# Patient Record
Sex: Female | Born: 1950 | Race: White | Hispanic: No | Marital: Married | Smoking: Never smoker
Health system: Southern US, Community
[De-identification: ages and names within clinical notes are randomized; demographics above are authoritative.]

## PROBLEM LIST (undated history)

## (undated) HISTORY — PX: FOOT SURGERY: SHX648

## (undated) HISTORY — PX: BREAST BIOPSY: SHX20

---

## 2010-05-12 ENCOUNTER — Encounter: Admission: RE | Admit: 2010-05-12 | Discharge: 2010-05-12 | Payer: Self-pay | Admitting: Otolaryngology

## 2011-05-23 ENCOUNTER — Encounter: Payer: Self-pay | Admitting: Emergency Medicine

## 2011-05-23 ENCOUNTER — Emergency Department (HOSPITAL_BASED_OUTPATIENT_CLINIC_OR_DEPARTMENT_OTHER)
Admission: EM | Admit: 2011-05-23 | Discharge: 2011-05-23 | Disposition: A | Discharge status: Home / Self Care | Payer: 59 | Attending: Emergency Medicine | Admitting: Emergency Medicine

## 2011-05-23 DIAGNOSIS — R220 Localized swelling, mass and lump, head: Secondary | ICD-10-CM | POA: Insufficient documentation

## 2011-05-23 DIAGNOSIS — R221 Localized swelling, mass and lump, neck: Secondary | ICD-10-CM | POA: Insufficient documentation

## 2011-05-23 DIAGNOSIS — T63441A Toxic effect of venom of bees, accidental (unintentional), initial encounter: Secondary | ICD-10-CM

## 2011-05-23 DIAGNOSIS — R0602 Shortness of breath: Secondary | ICD-10-CM | POA: Insufficient documentation

## 2011-05-23 DIAGNOSIS — T63461A Toxic effect of venom of wasps, accidental (unintentional), initial encounter: Secondary | ICD-10-CM | POA: Insufficient documentation

## 2011-05-23 DIAGNOSIS — T6391XA Toxic effect of contact with unspecified venomous animal, accidental (unintentional), initial encounter: Secondary | ICD-10-CM | POA: Insufficient documentation

## 2011-05-23 MED ORDER — DIPHENHYDRAMINE HCL 50 MG/ML IJ SOLN
25.0000 mg | Freq: Once | INTRAMUSCULAR | Status: AC
  Administered 2011-05-23: 25 mg via INTRAVENOUS
  Filled 2011-05-23: qty 1

## 2011-05-23 MED ORDER — DIPHENHYDRAMINE HCL 25 MG PO TABS: 25.0000 mg | ORAL_TABLET | Freq: Four times a day (QID) | ORAL | Status: AC | PRN

## 2011-05-23 MED ORDER — PREDNISONE 10 MG PO TABS: 20.0000 mg | ORAL_TABLET | Freq: Every day | ORAL | Status: AC

## 2011-05-23 MED ORDER — FAMOTIDINE IN NACL 20-0.9 MG/50ML-% IV SOLN
20.0000 mg | Freq: Once | INTRAVENOUS | Status: AC
  Administered 2011-05-23: 20 mg via INTRAVENOUS
  Filled 2011-05-23: qty 50

## 2011-05-23 MED ORDER — SODIUM CHLORIDE 0.9 % IV SOLN
Freq: Once | INTRAVENOUS | Status: AC
  Administered 2011-05-23: 21:00:00 via INTRAVENOUS

## 2011-05-23 MED ORDER — METHYLPREDNISOLONE SODIUM SUCC 125 MG IJ SOLR
125.0000 mg | Freq: Once | INTRAMUSCULAR | Status: AC
  Administered 2011-05-23: 125 mg via INTRAVENOUS
  Filled 2011-05-23: qty 2

## 2011-05-23 MED ORDER — EPINEPHRINE 0.3 MG/0.3ML IJ DEVI: 0.3000 mg | INTRAMUSCULAR | Status: AC | PRN

## 2011-05-23 NOTE — ED Notes (Signed)
Patient is resting comfortably. No needs voiced at this time

## 2011-05-23 NOTE — ED Provider Notes (Signed)
History     Chief Complaint  Patient presents with  . Insect Bite   Patient is a 60 y.o. female presenting with allergic reaction. The history is provided by the patient.  Allergic Reaction The primary symptoms are  shortness of breath (chest tightness), rash and angioedema. The primary symptoms do not include cough or altered mental status. The current episode started 1 to 2 hours ago.  The rash is not associated with itching.  The angioedema began 1 to 2 hours ago. The angioedema has been gradually worsening since its onset. It is located on the lips. The angioedema is associated with shortness of breath (chest tightness).  The onset of the reaction was associated with insect bite/sting (stung behind the left knee by yellow jackets and has redness. ). Significant symptoms that are not present include eye redness, rhinorrhea or itching.  her lower lip has swollen up and she feels as if her tongue is swollen.   History reviewed. No pertinent past medical history.  History reviewed. No pertinent past surgical history.  No family history on file.  History  Substance Use Topics  . Smoking status: Never Smoker   . Smokeless tobacco: Not on file  . Alcohol Use: Yes     occasional    OB History    Grav Para Term Preterm Abortions TAB SAB Ect Mult Living                  Review of Systems  HENT: Positive for facial swelling. Negative for ear pain, congestion and rhinorrhea.   Eyes: Negative for photophobia, pain and redness.  Respiratory: Positive for chest tightness and shortness of breath (chest tightness). Negative for cough.   Gastrointestinal: Negative for abdominal distention.  Musculoskeletal: Negative for back pain.  Skin: Positive for rash. Negative for itching.  Neurological: Negative for seizures.  Psychiatric/Behavioral: Negative for altered mental status.    Physical Exam  BP 150/80  Pulse 78  Temp(Src) 98.3 F (36.8 C) (Oral)  Resp 16  SpO2 97%  Physical  Exam  Constitutional: She appears well-developed.  HENT:  Head: Normocephalic.       Swelling of left lower lip.   Eyes: Pupils are equal, round, and reactive to light.  Cardiovascular: Normal rate.   Pulmonary/Chest: Effort normal.  Abdominal: Soft.  Neurological: She is alert.  Skin: Skin is warm. Rash noted. There is erythema (large areas of redness without induration behind left knee. ).    ED Course  Procedures  MDM Yellow jacket stings followed with allergic reaction. Some lip swelling. Improving with ED stay. Chest was tight, but no wheezing. Will d/c.       Juliet Rude. Rubin Payor, MD 05/23/11 2300

## 2011-11-09 IMAGING — CR DG CERVICAL SPINE COMPLETE 4+V
5 series · 5 of 5 positions shown · non-contrast
Comparison: None.

CLINICAL DATA: 59-year-old female with right year and neck pain
posteriorly.  No known injury.

CERVICAL SPINE - COMPLETE 4+ VIEW

[w c-spine lat]
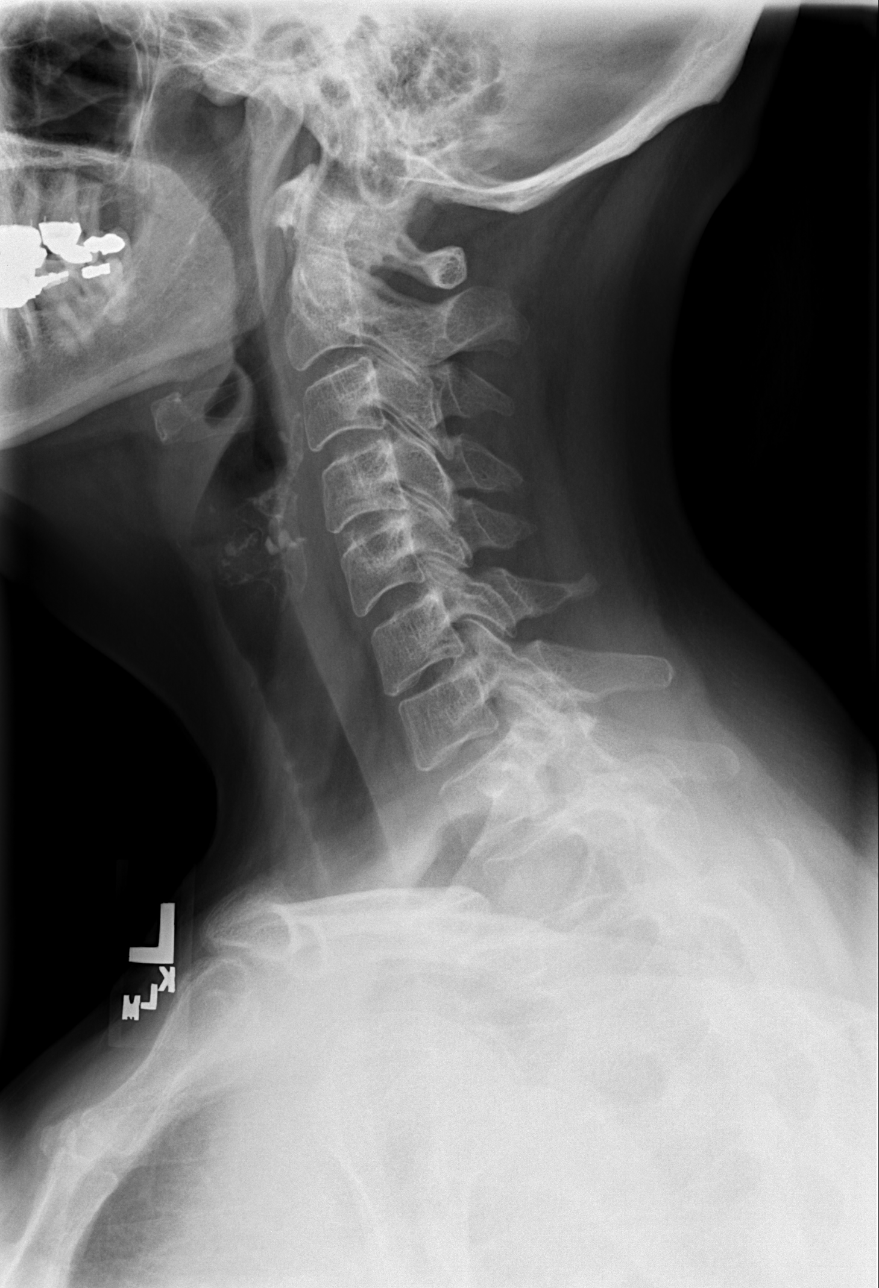

[w c-spine oblique (1 of 2)]
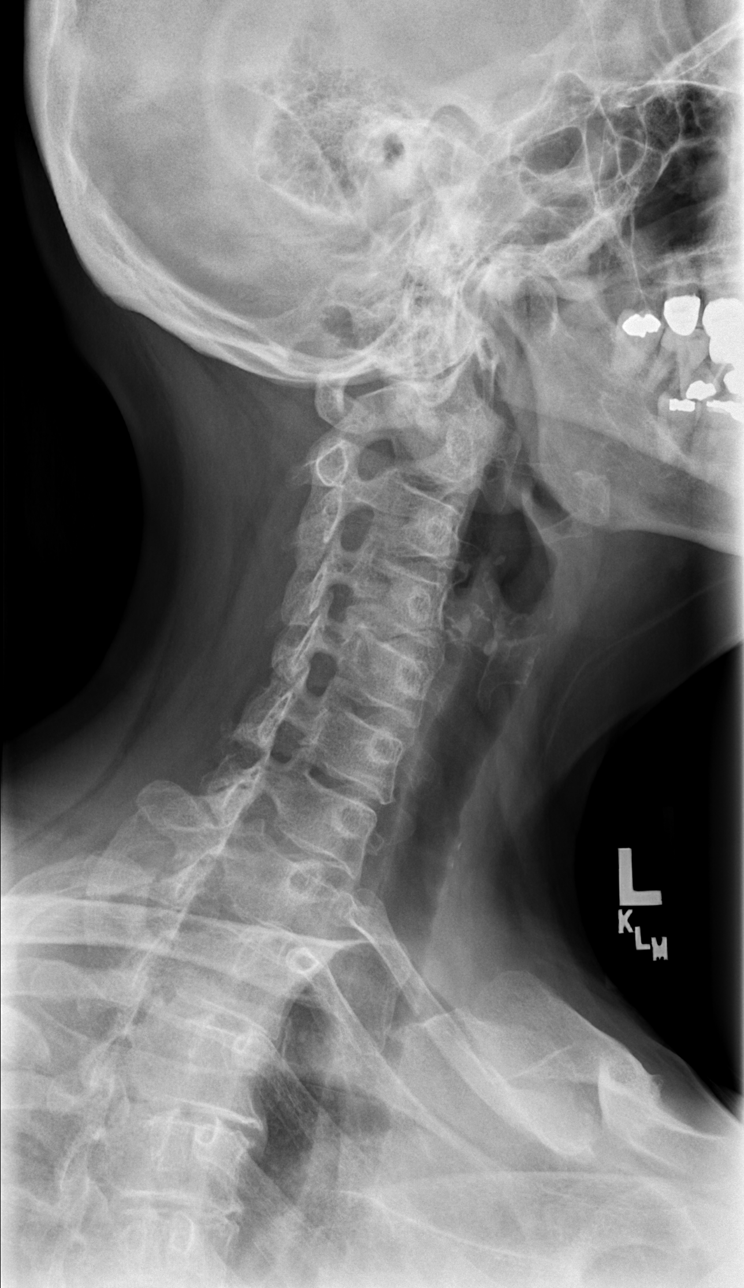

[w c-spine oblique (2 of 2)]
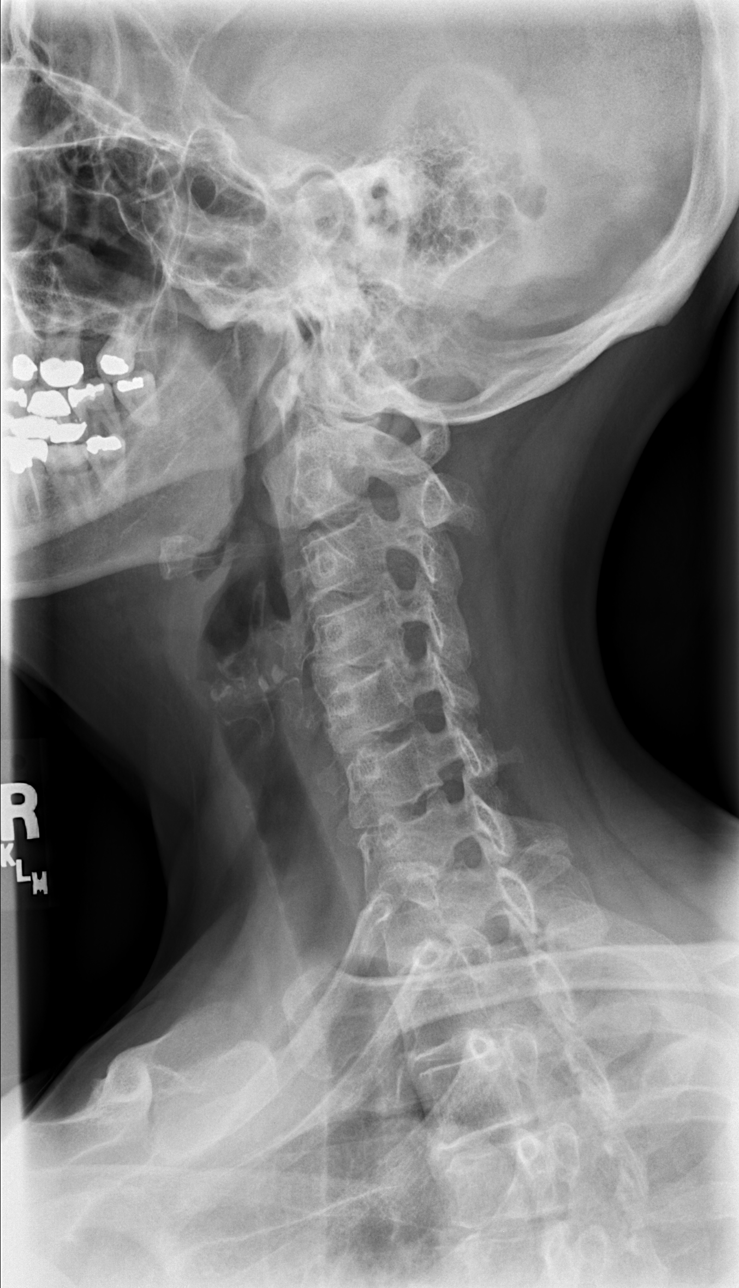

[w c-spine a.p. *]
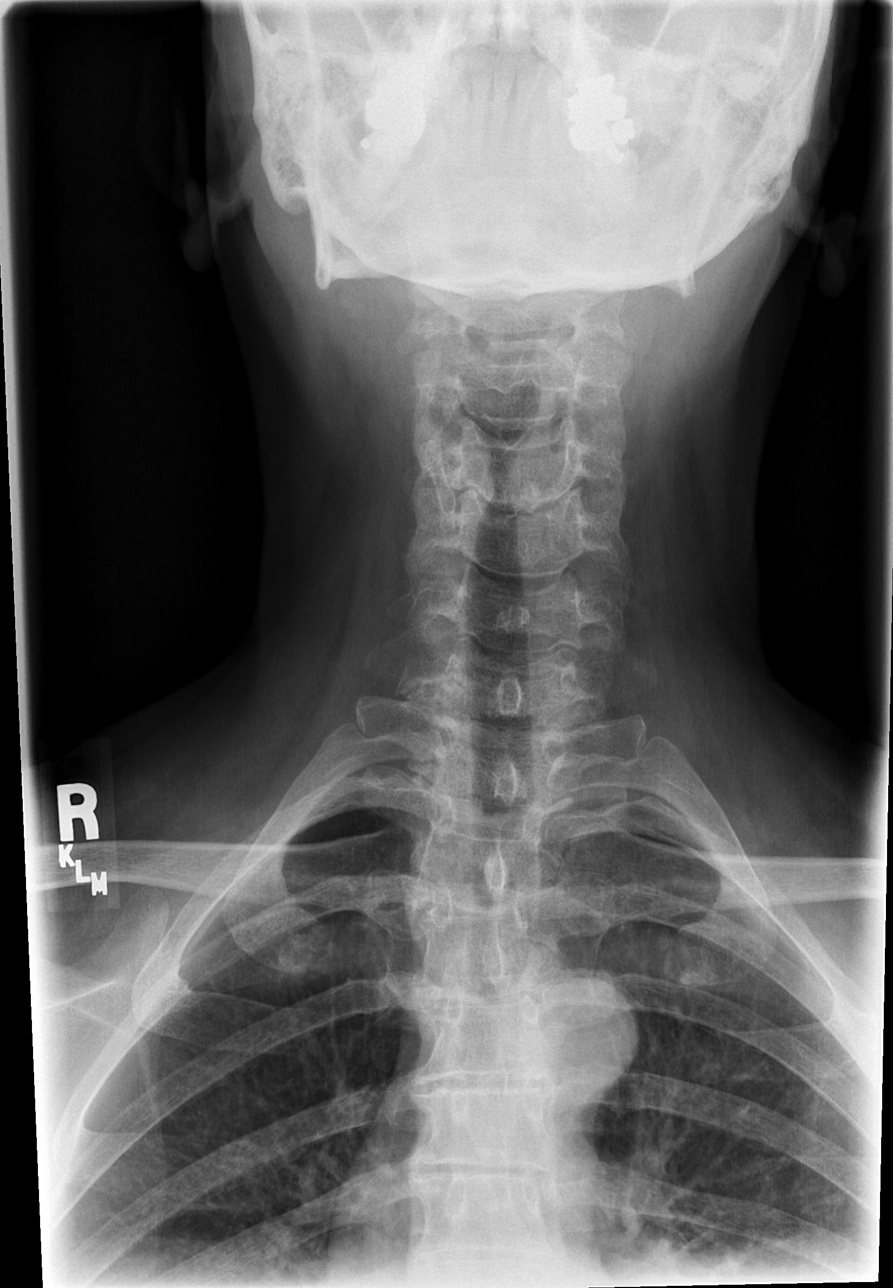

[w c-spine odontoid *]
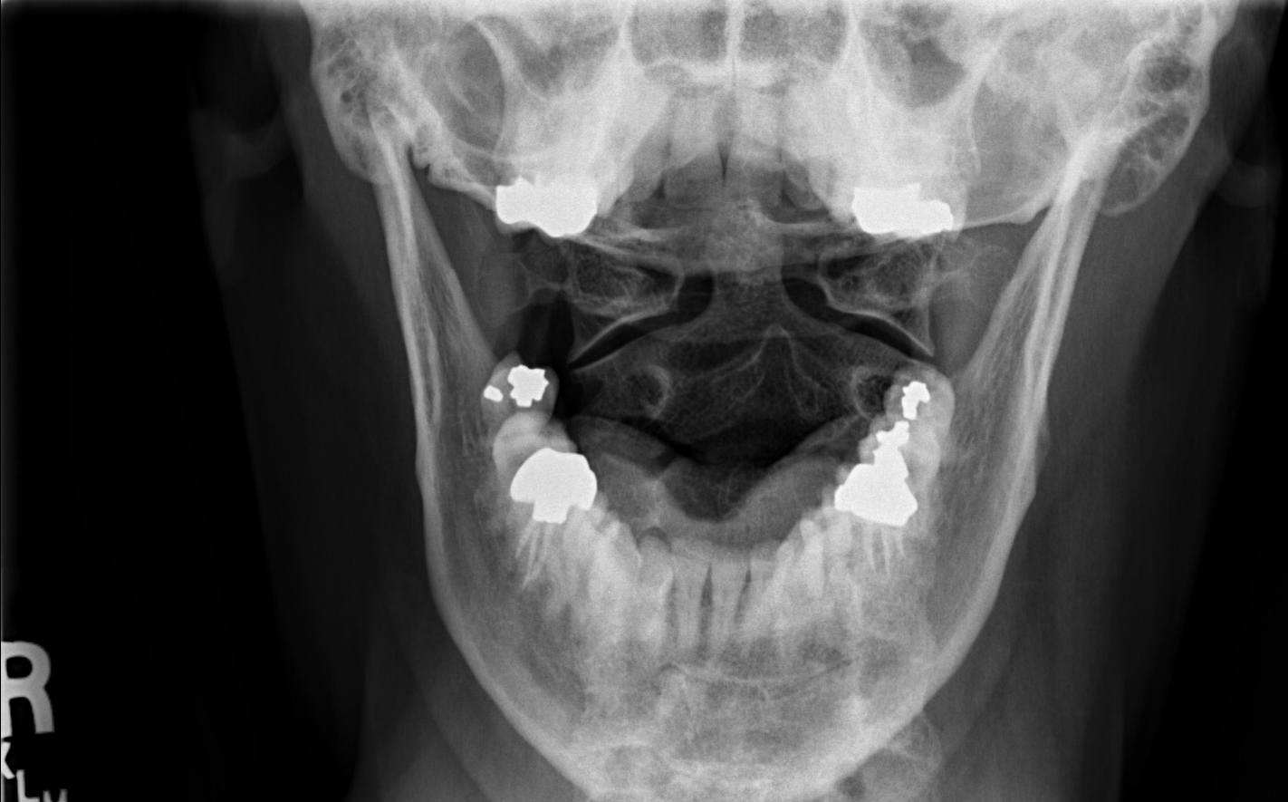

[5 of 5 positions shown; findings below may reference images not displayed]

FINDINGS: Straightening of cervical lordosis.  Prevertebral soft
tissues within normal limits.  The Cervicothoracic junction
alignment is within normal limits.  Bilateral posterior element
alignment is within normal limits.  AP alignment, C1-C2 alignment
and odontoid process within normal limits.  No significant cervical
disc space narrowing.  Occasional mild uncovertebral hypertrophy,
no significant osseous neural foraminal stenosis.  Visualized lung
apices are clear.
IMPRESSION: 1.  Nonspecific straightening of cervical lordosis may be
positional, reflect muscle spasm or soft tissue/ligamentous injury.
2. No acute osseous abnormality identified in the cervical spine.

## 2012-06-26 ENCOUNTER — Emergency Department (HOSPITAL_BASED_OUTPATIENT_CLINIC_OR_DEPARTMENT_OTHER)
Admission: EM | Admit: 2012-06-26 | Discharge: 2012-06-26 | Disposition: A | Discharge status: Home / Self Care | Payer: 59 | Attending: Emergency Medicine | Admitting: Emergency Medicine

## 2012-06-26 ENCOUNTER — Encounter (HOSPITAL_BASED_OUTPATIENT_CLINIC_OR_DEPARTMENT_OTHER): Payer: Self-pay | Admitting: Emergency Medicine

## 2012-06-26 DIAGNOSIS — T6391XA Toxic effect of contact with unspecified venomous animal, accidental (unintentional), initial encounter: Secondary | ICD-10-CM | POA: Insufficient documentation

## 2012-06-26 DIAGNOSIS — Z91038 Other insect allergy status: Secondary | ICD-10-CM | POA: Insufficient documentation

## 2012-06-26 DIAGNOSIS — T63481A Toxic effect of venom of other arthropod, accidental (unintentional), initial encounter: Secondary | ICD-10-CM

## 2012-06-26 DIAGNOSIS — T63461A Toxic effect of venom of wasps, accidental (unintentional), initial encounter: Secondary | ICD-10-CM | POA: Insufficient documentation

## 2012-06-26 MED ORDER — PREDNISONE 20 MG PO TABS: 60.0000 mg | ORAL_TABLET | Freq: Every day | ORAL | Status: AC

## 2012-06-26 MED ORDER — PREDNISONE 50 MG PO TABS
60.0000 mg | ORAL_TABLET | Freq: Once | ORAL | Status: AC
  Administered 2012-06-26: 60 mg via ORAL
  Filled 2012-06-26: qty 1

## 2012-06-26 MED ORDER — FAMOTIDINE 20 MG PO TABS
20.0000 mg | ORAL_TABLET | Freq: Once | ORAL | Status: AC
  Administered 2012-06-26: 20 mg via ORAL
  Filled 2012-06-26: qty 1

## 2012-06-26 MED ORDER — FAMOTIDINE 20 MG PO TABS: 20.0000 mg | ORAL_TABLET | Freq: Two times a day (BID) | ORAL | Status: AC

## 2012-06-26 NOTE — ED Provider Notes (Signed)
History     CSN: [id number]  Arrival date & time 06/26/12  1127   First MD Initiated Contact with Patient 06/26/12 1135      Chief Complaint  Patient presents with  . Allergic Reaction    (Consider location/radiation/quality/duration/timing/severity/associated sxs/prior treatment) HPI Pt with history of allergy to bee stings reports she was stung twice by a wasp just prior to arrival, once on forehead and then a few minutes later on the forearm. She has had marked swelling of her face but no throat swelling, tongue swelling. No difficulty with swallowing or breathing. No lightheadedness or dizziness. Did not use EpiPen, but took Benadryl 50mg  prior to arrival.   History reviewed. No pertinent past medical history.  Past Surgical History  Procedure Date  . Foot surgery   . Breast biopsy     No family history on file.  History  Substance Use Topics  . Smoking status: Never Smoker   . Smokeless tobacco: Not on file  . Alcohol Use: Yes     occasional    OB History    Grav Para Term Preterm Abortions TAB SAB Ect Mult Living                  Review of Systems All other systems reviewed and are negative except as noted in HPI.   Allergies  Review of patient's allergies indicates no known allergies.  Home Medications   Current Outpatient Rx  Name Route Sig Dispense Refill  . PROBIOTIC DAILY PO Oral Take by mouth.    Marland Kitchen CALCIUM CARBONATE-VITAMIN D 600-400 MG-UNIT PO TABS Oral Take 1 tablet by mouth daily.      . CHLORPHEN-PSEUDOEPHED-APAP 2-30-325 MG PO CAPS Oral Take 2 capsules by mouth 3 (three) times daily.      Marland Kitchen EPINEPHRINE 0.3 MG/0.3ML IJ DEVI Intramuscular Inject 0.3 mLs (0.3 mg total) into the muscle as needed. 2 Device 1  . MULTIVITAMIN PO Oral Take 1 tablet by mouth daily.      Marland Kitchen NAPROXEN SODIUM 220 MG PO TABS Oral Take 220 mg by mouth 2 (two) times daily with a meal.        BP 153/79  Pulse 83  Temp 98.8 F (37.1 C) (Oral)  Resp 16  SpO2  97%  Physical Exam  Nursing note and vitals reviewed. Constitutional: She is oriented to person, place, and time. She appears well-developed and well-nourished.  HENT:  Head: Normocephalic and atraumatic.  Eyes: EOM are normal. Pupils are equal, round, and reactive to light.  Neck: Normal range of motion. Neck supple.  Cardiovascular: Normal rate, normal heart sounds and intact distal pulses.   Pulmonary/Chest: Effort normal and breath sounds normal. No stridor.  Abdominal: Bowel sounds are normal. She exhibits no distension. There is no tenderness.  Musculoskeletal: Normal range of motion. She exhibits no edema and no tenderness.  Neurological: She is alert and oriented to person, place, and time. She has normal strength. No cranial nerve deficit or sensory deficit.  Skin: Skin is warm and dry. No rash noted.  Psychiatric: She has a normal mood and affect.    ED Course  Procedures (including critical care time)  Labs Reviewed - No data to display No results found.   No diagnosis found.    MDM  Pt given oral prednisone and pepcid. Observed without any progression of swelling. No concern for airway compromise or systemic anaphylaxis. Advised to return for any worsening.       Rice Walsh B. Bernette Mayers,  MD 06/26/12 1322

## 2012-06-26 NOTE — ED Notes (Signed)
Pt has slight decrease in bilateral periorbital swelling

## 2012-06-26 NOTE — ED Notes (Signed)
Pt c/o allergic reaction to bee sting (wasp); stung x 2 around 1015; significant bilateral periorbital edema  & redness noted. Pt took Benadryl 50 mg at 1030.

## 2015-11-12 ENCOUNTER — Encounter: Payer: Self-pay | Admitting: Physical Therapy

## 2015-11-12 ENCOUNTER — Ambulatory Visit: Payer: Commercial Managed Care - HMO | Attending: Sports Medicine | Admitting: Physical Therapy

## 2015-11-12 DIAGNOSIS — M25551 Pain in right hip: Secondary | ICD-10-CM

## 2015-11-12 DIAGNOSIS — R262 Difficulty in walking, not elsewhere classified: Secondary | ICD-10-CM | POA: Diagnosis present

## 2015-11-12 NOTE — Therapy (Signed)
[facility] [address] [place], [place], [address] Phone: [phone]   Fax:  [phone]  Physical Therapy Evaluation  Patient Details  Name: Ashley Barron MRN: [id number] Date of Birth: 29-Dec-1950 Referring Provider: Leonel RamsayFelder  Encounter Date: 11/12/2015      PT End of Session - 11/12/15 1138    Visit Number 1   Date for PT Re-Evaluation 01/13/16   PT Start Time 1045   PT Stop Time 1135   PT Time Calculation (min) 50 min   Activity Tolerance Patient tolerated treatment well   Behavior [facility] Therapy [facility] for tasks assessed/performed      History reviewed. No pertinent past medical history.  Past Surgical History  Procedure Laterality Date  . Foot surgery    . Breast biopsy      There were no vitals filed for this visit.  Visit Diagnosis:  Right hip pain - Plan: PT plan of care cert/re-cert  Difficulty walking - Plan: PT plan of care cert/re-cert      Subjective Assessment - 11/12/15 1103    Subjective Pateint underwent a right THR (posterior approach) on 10/21/15.  Had home PT until last Wednesday.  She reports that she was progressing well but had an infectin start last week.   Patient Stated Goals walk and have no pain   Currently in Pain? Yes   Pain Score 5    Pain Location Hip   Pain Orientation Right   Pain Descriptors / Indicators Aching;Sharp   Pain Type Surgical pain   Pain Onset 1 to 4 weeks ago   Pain Frequency Constant   Aggravating Factors  being up and walking getting in and out of the car 7/10   Pain Relieving Factors rest, pain meds   Effect of Pain on Daily Activities limts everything            [facility] PT Assessment - 11/12/15 0001    Assessment   Medical Diagnosis s/p right THR   Referring Provider Felder   Onset Date/Surgical Date 10/21/15   Next MD Visit 12/02/15   Prior Therapy at home   Precautions   Precautions Posterior Hip   Balance Screen   Has the patient fallen in  the past 6 months No   Has the patient had a decrease in activity level because of a fear of falling?  No   Is the patient reluctant to leave their home because of a fear of falling?  No   Home Environment   Additional Comments lives with spouse, 3 steps into the home, normally does housework   Prior Function   Level of Independence Independent   Vocation Full time employment   Vocation Requirements teaches preschool   Leisure no exercise   AROM   Overall AROM Comments AROM of the right hip in standing flexion 55 degrees, abduction 10, extension 5 degrees all motions increase the pain   Strength   Overall Strength Comments 3+/5 for the right hip with some pain   Palpation   Palpation comment she is tender and sore in the distal scar   Ambulation/Gait   Gait Comments gait is with a FWW, slow, mild antalgic gait on the right                   [facility] - 11/12/15 0001    Exercises   Exercises Knee/Hip   Knee/Hip Exercises: Aerobic   Nustep Level 3 x 5 minutes  Knee/Hip Exercises: Machines for Strengthening   Cybex Knee Extension 5# 2x10,    Cybex Knee Flexion 20# 2x10                  PT Short Term Goals - 11/12/15 1142    PT SHORT TERM GOAL #1   Title verbalize understanding of THR precautions   Time 1   Period Weeks   Status New           PT Long Term Goals - 11/12/15 1143    PT LONG TERM GOAL #1   Title decrease pain 50%   Time 8   Period Weeks   Status New   PT LONG TERM GOAL #2   Title increase AROM of the right hip to 90 degrees flexion in standing   Time 8   Period Weeks   Status New   PT LONG TERM GOAL #3   Title walk without asssitive device with minimal deviation   Time 8   Period Weeks   Status New   PT LONG TERM GOAL #4   Title increase right hip strength to 4/5   Time 8   Period Weeks   Status New               Plan - 11/12/15 1139    Clinical Impression Statement Patient underwent a  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  She reports that she had been having hip pain for 4 years.  She describes pain and tenderness in the scar area.  She does understand the THR precautions.  I went over them again with her today..  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   Pt will benefit from skilled therapeutic intervention in order to improve on the following deficits Abnormal gait;Cardiopulmonary status limiting activity;Decreased activity tolerance;Decreased balance;Decreased range of motion;Decreased mobility;Decreased scar mobility;Decreased strength;Difficulty walking;Impaired flexibility;Pain   Rehab Potential Good   PT Frequency 2x / week   PT Duration 8 weeks   PT Treatment/Interventions ADLs/Self Care Home Management;Electrical Stimulation;Cryotherapy;Therapeutic exercise;Therapeutic activities;Functional mobility training;Stair training;Gait training;Balance training;Patient/family education;Manual techniques;Scar mobilization   PT Next Visit Plan Alowly add exercises as tolerated as well as gait   Consulted and Agree with Plan of Care Patient         Problem List There are no active problems to display for this patient.   Jearld Lesch., PT 11/12/2015, 11:49 AM  [facility] [address] [facility] [address] [place], [place], [address] Phone: [phone]   Fax:  [phone]  Name: Ashley Barron MRN: [id number] Date of Birth: [birth year]

## 2015-11-15 ENCOUNTER — Ambulatory Visit: Payer: Commercial Managed Care - HMO | Admitting: Physical Therapy

## 2015-11-15 ENCOUNTER — Encounter: Payer: Self-pay | Admitting: Physical Therapy

## 2015-11-15 DIAGNOSIS — R262 Difficulty in walking, not elsewhere classified: Secondary | ICD-10-CM

## 2015-11-15 DIAGNOSIS — M25551 Pain in right hip: Secondary | ICD-10-CM | POA: Diagnosis not present

## 2015-11-15 NOTE — Therapy (Signed)
[facility] [address] [place], [place], [address] Phone: [phone]   Fax:  [phone]  Physical Therapy Treatment  Patient Details  Name: Ashley Barron MRN: [id number] Date of Birth: [birth year] Referring Provider: Leonel Ramsay  Encounter Date: 11/15/2015      PT End of Session - 11/15/15 1142    Visit Number 2   Date for PT Re-Evaluation 01/13/16   PT Start Time 1100   PT Stop Time 1142   PT Time Calculation (min) 42 min   Activity Tolerance Patient tolerated treatment well   Behavior During Therapy [facility] for tasks assessed/performed      History reviewed. No pertinent past medical history.  Past Surgical History  Procedure Laterality Date  . Foot surgery    . Breast biopsy      There were no vitals filed for this visit.  Visit Diagnosis:  Difficulty walking  Right hip pain      Subjective Assessment - 11/15/15 1054    Subjective Pt reports that her R leg was aching last night, otherwise no issues.   Currently in Pain? Yes   Pain Score 4    Pain Location Hip   Pain Orientation Right                         [facility] Adult PT Treatment/Exercise - 11/15/15 0001    Ambulation/Gait   Ambulation/Gait Yes   Ambulation Distance (Feet) 60 Feet   Assistive device Rolling walker   Gait Pattern Decreased arm swing - right;Decreased arm swing - left;Step-through pattern  slight antalgic, Reports that she feels like her RLE is longer than her LLE.   Gait velocity slow    Gait Comments R Lateral sway, 2nd gait trial with single point cane 60 ft. Pt given skilled demo on proper use os SPC.    Knee/Hip Exercises: Aerobic   Nustep Level 3 x 6 minutes   Knee/Hip Exercises: Standing   Forward Step Up 2 sets;10 reps;Right;Step Height: 4"  with RW   Knee/Hip Exercises: Seated   Long Arc Quad 2 sets;10 reps  3lb   Ball Squeeze 2x15    Abduction/Adduction  2 sets;15 reps   Abd/Adduction Limitations  yellow Tband    Sit to [employer] 2 sets;5 reps;with UE support                  PT Short Term Goals - 11/12/15 1142    PT SHORT TERM GOAL #1   Title verbalize understanding of THR precautions   Time 1   Period Weeks   Status New           PT Long Term Goals - 11/12/15 1143    PT LONG TERM GOAL #1   Title decrease pain 50%   Time 8   Period Weeks   Status New   PT LONG TERM GOAL #2   Title increase AROM of the right hip to 90 degrees flexion in standing   Time 8   Period Weeks   Status New   PT LONG TERM GOAL #3   Title walk without asssitive device with minimal deviation   Time 8   Period Weeks   Status New   PT LONG TERM GOAL #4   Title increase right hip strength to 4/5   Time 8   Period Weeks   Status New  Plan - 11/15/15 1142    Clinical Impression Statement Pt able to progress to gym level interventions this date. Tolerated all interventions well with out c/o of increase pain. Does have some soreness from muscle fatigue. Does ambulate with a slight antalgic gait. Pt reports that her RLE feels longer than her L.   Pt will benefit from skilled therapeutic intervention in order to improve on the following deficits Abnormal gait;Cardiopulmonary status limiting activity;Decreased activity tolerance;Decreased balance;Decreased range of motion;Decreased mobility;Decreased scar mobility;Decreased strength;Difficulty walking;Impaired flexibility;Pain   Rehab Potential Good   PT Frequency 2x / week   PT Duration 8 weeks   PT Treatment/Interventions ADLs/Self Care Home Management;Electrical Stimulation;Cryotherapy;Therapeutic exercise;Therapeutic activities;Functional mobility training;Stair training;Gait training;Balance training;Patient/family education;Manual techniques;Scar mobilization   PT Next Visit Plan Slowly add exercises as tolerated as well as gait        Problem List There are no active problems to display for this  patient.   Grayce Sessionsonald G Annessa Satre, PTA  11/15/2015, 11:46 AM  [facility] [address] [place], [place], [address] Phone: [phone]   Fax:  [phone]  Name: Ashley MauKaren Barron MRN: [id number] Date of Birth: Mar 16, 1951

## 2015-11-19 ENCOUNTER — Encounter: Payer: Self-pay | Admitting: Physical Therapy

## 2015-11-19 ENCOUNTER — Ambulatory Visit: Payer: Commercial Managed Care - HMO | Attending: Sports Medicine | Admitting: Physical Therapy

## 2015-11-19 DIAGNOSIS — R262 Difficulty in walking, not elsewhere classified: Secondary | ICD-10-CM | POA: Diagnosis present

## 2015-11-19 DIAGNOSIS — M25551 Pain in right hip: Secondary | ICD-10-CM | POA: Insufficient documentation

## 2015-11-19 NOTE — Therapy (Signed)
[facility] [address] [place], [place], [address] Phone: [phone]   Fax:  [phone]  Physical Therapy Treatment  Patient Details  Name: Ashley Barron MRN: [id number] Date of Birth: Nov 29, 1950 Referring Provider: Leonel Ramsay  Encounter Date: 11/19/2015      PT End of Session - 11/19/15 1143    Visit Number 3   Date for PT Re-Evaluation 01/13/16   PT Start Time 1100   PT Stop Time 1143   PT Time Calculation (min) 43 min   Activity Tolerance Patient tolerated treatment well   Behavior During Therapy [facility] for tasks assessed/performed      History reviewed. No pertinent past medical history.  Past Surgical History  Procedure Laterality Date  . Foot surgery    . Breast biopsy      There were no vitals filed for this visit.  Visit Diagnosis:  Right hip pain  Difficulty walking      Subjective Assessment - 11/19/15 1100    Subjective Pt reports that she had aching pain in her R hip yesterday otherwise no new problems.    Currently in Pain? Yes   Pain Score 4    Pain Location Hip   Pain Orientation Right                         [facility] Adult PT Treatment/Exercise - 11/19/15 0001    Ambulation/Gait   Ambulation/Gait Yes   Ambulation Distance (Feet) 120 Feet   Assistive device Body weight support system;Straight cane   Gait Pattern Decreased hip/knee flexion - right   Gait velocity slow   Gait Comments Minor R lateral sway, 2nd gait trial without AD R lateral sway increased.   High Level Balance   High Level Balance Activities Side stepping;Backward walking   Exercises   Exercises Knee/Hip   Knee/Hip Exercises: Aerobic   Nustep Level 3 x 6 minutes   Knee/Hip Exercises: Machines for Strengthening   Cybex Knee Extension 5# 2x15    Cybex Knee Flexion 15# 2x15   Knee/Hip Exercises: Standing   Forward Step Up 2 sets;Right;Step Height: 4";15 reps  RW, some pain 2nd set    Other Standing Knee  Exercises Standing hip Abd,Flx, and Ext with RW 2x10    Other Standing Knee Exercises Calf raises with RW 2x10    Knee/Hip Exercises: Seated   Ball Squeeze 2x15    Abduction/Adduction  2 sets;15 reps   Abd/Adduction Limitations blue Tband    Sit to Sand 2 sets;5 reps;with UE support                  PT Short Term Goals - 11/12/15 1142    PT SHORT TERM GOAL #1   Title verbalize understanding of THR precautions   Time 1   Period Weeks   Status New           PT Long Term Goals - 11/12/15 1143    PT LONG TERM GOAL #1   Title decrease pain 50%   Time 8   Period Weeks   Status New   PT LONG TERM GOAL #2   Title increase AROM of the right hip to 90 degrees flexion in standing   Time 8   Period Weeks   Status New   PT LONG TERM GOAL #3   Title walk without asssitive device with minimal deviation   Time 8   Period Weeks   Status  New   PT LONG TERM GOAL #4   Title increase right hip strength to 4/5   Time 8   Period Weeks   Status New               Plan - 11/19/15 1144    Clinical Impression Statement Pt tolerated exercises well this date. Does report R hip pain with Step ups. Pt able to progress with side steps and backwards walking.     Pt will benefit from skilled therapeutic intervention in order to improve on the following deficits Abnormal gait;Cardiopulmonary status limiting activity;Decreased activity tolerance;Decreased balance;Decreased range of motion;Decreased mobility;Decreased scar mobility;Decreased strength;Difficulty walking;Impaired flexibility;Pain   Rehab Potential Good   PT Frequency 2x / week   PT Duration 8 weeks   PT Treatment/Interventions ADLs/Self Care Home Management;Electrical Stimulation;Cryotherapy;Therapeutic exercise;Therapeutic activities;Functional mobility training;Stair training;Gait training;Balance training;Patient/family education;Manual techniques;Scar mobilization   PT Next Visit Plan Slowly add exercises as  tolerated as well as gait        Problem List There are no active problems to display for this patient.   Grayce Sessionsonald G Hunter Pinkard, PTA  11/19/2015, 11:46 AM  [facility] [address] [place], [place], [address] Phone: [phone]   Fax:  [phone]  Name: Ashley Barron MRN: [id number] Date of Birth: 03/03/1951

## 2015-11-21 ENCOUNTER — Encounter: Payer: Self-pay | Admitting: Physical Therapy

## 2015-11-21 ENCOUNTER — Ambulatory Visit: Payer: Commercial Managed Care - HMO | Admitting: Physical Therapy

## 2015-11-21 DIAGNOSIS — R262 Difficulty in walking, not elsewhere classified: Secondary | ICD-10-CM

## 2015-11-21 DIAGNOSIS — M25551 Pain in right hip: Secondary | ICD-10-CM | POA: Diagnosis not present

## 2015-11-21 NOTE — Therapy (Signed)
[place], [place], [address] Phone: [phone]   Fax:  [phone]  Physical Therapy Treatment  Patient Details  Name: Ashley Barron MRN: [id number] Date of Birth: Jul 27, 1951 Referring Provider: Davina Poke  Encounter Date: 11/21/2015      PT End of Session - 11/21/15 1511    Visit Number 4   Date for PT Re-Evaluation 01/13/16   PT Start Time 0938   PT Stop Time 1829   PT Time Calculation (min) 42 min      History reviewed. No pertinent past medical history.  Past Surgical History  Procedure Laterality Date  . Foot surgery    . Breast biopsy      There were no vitals filed for this visit.  Visit Diagnosis:  Difficulty walking  Right hip pain      Subjective Assessment - 11/21/15 1432    Subjective "Things are going fine,but I was sore yesterday because my husband made me walk around [place] and [employer]"   Currently in Pain? Yes   Pain Score 2    Pain Location Hip   Pain Orientation Right                         [facility] Adult PT Treatment/Exercise - 11/21/15 0001    Ambulation/Gait   Ambulation/Gait Yes   Ambulation Distance (Feet) 180 Feet   Assistive device Straight cane   Gait velocity slow   Gait Comments I with SPC    High Level Balance   High Level Balance Comments SLS RLE 3 x 10 sec    Knee/Hip Exercises: Aerobic   Nustep Level 3 x 6 minutes   Knee/Hip Exercises: Machines for Strengthening   Cybex Knee Extension 10# 3x10    Cybex Knee Flexion 20# 3x10   Cybex Leg Press #20 3x10   Knee/Hip Exercises: Standing   Hip Abduction Right;2 sets;10 reps  #2    Hip Extension Right;2 sets;10 reps  #2   Other Standing Knee Exercises Standing hip and kne flex #2 2x10   Knee/Hip Exercises: Seated   Ball Squeeze 2x15    Abduction/Adduction  2 sets;10 reps  manual resistance.    Sit to Sand 10 reps;with UE support;3 sets                  PT Short  Term Goals - 11/12/15 1142    PT SHORT TERM GOAL #1   Title verbalize understanding of THR precautions   Time 1   Period Weeks   Status New           PT Long Term Goals - 11/21/15 1514    PT LONG TERM GOAL #1   Title decrease pain 50%   PT LONG TERM GOAL #3   Title walk without asssitive device with minimal deviation   Status Partially Met               Plan - 11/21/15 1511    Clinical Impression Statement Pt performed well this PT date. Pt able to tolerate more intense interventions without c/o increase pain. Pt able to progress to leg press and standing activities with weight.    Pt will benefit from skilled therapeutic intervention in order to improve on the following deficits Abnormal gait;Cardiopulmonary status limiting activity;Decreased activity tolerance;Decreased balance;Decreased range of motion;Decreased mobility;Decreased scar mobility;Decreased strength;Difficulty walking;Impaired flexibility;Pain   Rehab Potential Good   PT Frequency 2x /  week   PT Duration 8 weeks   PT Treatment/Interventions ADLs/Self Care Home Management;Electrical Stimulation;Cryotherapy;Therapeutic exercise;Therapeutic activities;Functional mobility training;Stair training;Gait training;Balance training;Patient/family education;Manual techniques;Scar mobilization   PT Next Visit Plan progress as tolerated        Problem List There are no active problems to display for this patient.   Scot Jun, PTA   11/21/2015, 3:15 PM  [address] [place], [place], [address] Phone: [phone]   Fax:  [phone]  Name: Ashley Barron MRN: [id number] Date of Birth: [birth year]

## 2015-11-26 ENCOUNTER — Encounter: Payer: Self-pay | Admitting: Physical Therapy

## 2015-11-26 ENCOUNTER — Ambulatory Visit: Payer: Commercial Managed Care - HMO | Admitting: Physical Therapy

## 2015-11-26 DIAGNOSIS — R262 Difficulty in walking, not elsewhere classified: Secondary | ICD-10-CM

## 2015-11-26 DIAGNOSIS — M25551 Pain in right hip: Secondary | ICD-10-CM | POA: Diagnosis not present

## 2015-11-26 NOTE — Therapy (Signed)
[place], [place], [address] Phone: [phone]   Fax:  [phone]  Physical Therapy Treatment  Patient Details  Name: Ashley Barron MRN: [id number] Date of Birth: [birth year] Referring Provider: Davina Poke  Encounter Date: 11/26/2015      PT End of Session - 11/26/15 1601    Visit Number 5   Date for PT Re-Evaluation 01/13/16   PT Start Time 4403   PT Stop Time 1601   PT Time Calculation (min) 46 min   Activity Tolerance Patient tolerated treatment well   Behavior During Therapy [facility] for tasks assessed/performed      History reviewed. No pertinent past medical history.  Past Surgical History  Procedure Laterality Date  . Foot surgery    . Breast biopsy      There were no vitals filed for this visit.  Visit Diagnosis:  Right hip pain  Difficulty walking      Subjective Assessment - 11/26/15 1516    Subjective "Im doing good." "I was pretty tired after last treatment "   Currently in Pain? Yes   Pain Score 3    Pain Location Hip   Pain Orientation Right            [facility] PT Assessment - 11/26/15 0001    AROM   Overall AROM Comments AROM of the right hip in standing flexion 85 degrees, abduction 50, extension 50 degrees all motions increase the pain                     [facility] Adult PT Treatment/Exercise - 11/26/15 0001    Ambulation/Gait   Ambulation/Gait Yes   Ambulation Distance (Feet) 250 Feet   Assistive device None   Ambulation Surface Level;Indoor   Gait Comments slight limp, Negotiated 2 flights of stairs with one rail alternating pattern SBA.   Knee/Hip Exercises: Aerobic   Nustep Level 3 x 7 minutes   Knee/Hip Exercises: Machines for Strengthening   Cybex Knee Extension 10# 2x15    Cybex Knee Flexion 20# 2x15   Cybex Leg Press #20 2x15   Knee/Hip Exercises: Standing   Hip Abduction 15 reps;Both;1 set;Knee straight   Abduction Limitations 2   Hip  Extension 1 set;15 reps;Knee straight   Extension Limitations 2   Other Standing Knee Exercises Standing hip and knee flex #2 2x10   Other Standing Knee Exercises Standing march #2 2x10    Knee/Hip Exercises: Seated   Long Arc Quad Both;1 set;15 reps   Long Arc Quad Weight 2 lbs.   Ball Squeeze 2x15    Abduction/Adduction  2 sets;10 reps  manual resistance    Sit to Sand 10 reps;without UE support;3 sets  from airex on mat table                   PT Short Term Goals - 11/12/15 1142    PT SHORT TERM GOAL #1   Title verbalize understanding of THR precautions   Time 1   Period Weeks   Status New           PT Long Term Goals - 11/26/15 1602    PT LONG TERM GOAL #1   Title decrease pain 50%   Status Partially Met   PT LONG TERM GOAL #2   Title increase AROM of the right hip to 90 degrees flexion in standing   Status Partially Met   PT LONG TERM GOAL #3  Title walk without asssitive device with minimal deviation   Status Partially Met   PT LONG TERM GOAL #4   Title increase right hip strength to 4/5   Status Partially Met               Plan - 11/26/15 1603    Clinical Impression Statement Pt continue to do well in PT. Pt has increased her overall gait distance with only a mild limp without AD. Pt also able to progress to stair negotiation alternating pattern one rail  SBA. Pt able to tolerated additional interventions with increased weight/resistance.   Pt will benefit from skilled therapeutic intervention in order to improve on the following deficits Abnormal gait;Cardiopulmonary status limiting activity;Decreased activity tolerance;Decreased balance;Decreased range of motion;Decreased mobility;Decreased scar mobility;Decreased strength;Difficulty walking;Impaired flexibility;Pain   Rehab Potential Good   PT Frequency 2x / week   PT Duration 8 weeks   PT Treatment/Interventions ADLs/Self Care Home Management;Electrical Stimulation;Cryotherapy;Therapeutic  exercise;Therapeutic activities;Functional mobility training;Stair training;Gait training;Balance training;Patient/family education;Manual techniques;Scar mobilization   PT Next Visit Plan progress as tolerated        Problem List There are no active problems to display for this patient.   Scot Jun, PTA  11/26/2015, 4:05 PM  [place] [address] [place], [place], [address] Phone: [phone]   Fax:  [phone]  Name: Audra Kagel MRN: [id number] Date of Birth: 1951/06/22

## 2015-11-28 ENCOUNTER — Encounter: Payer: Self-pay | Admitting: Physical Therapy

## 2015-11-28 ENCOUNTER — Ambulatory Visit: Payer: Commercial Managed Care - HMO | Admitting: Physical Therapy

## 2015-11-28 DIAGNOSIS — M25551 Pain in right hip: Secondary | ICD-10-CM

## 2015-11-28 DIAGNOSIS — R262 Difficulty in walking, not elsewhere classified: Secondary | ICD-10-CM

## 2015-11-28 NOTE — Therapy (Addendum)
[place], [place], [address] Phone: [phone]   Fax:  [phone]  Physical Therapy Treatment  Patient Details  Name: Ashley Barron MRN: [id number] Date of Birth: Aug 19, 1951 Referring Provider: Davina Poke  Encounter Date: 11/28/2015      PT End of Session - 11/28/15 1558    PT Start Time 1515   PT Stop Time 1559   PT Time Calculation (min) 44 min      History reviewed. No pertinent past medical history.  Past Surgical History  Procedure Laterality Date  . Foot surgery    . Breast biopsy      There were no vitals filed for this visit.  Visit Diagnosis:  Difficulty walking  Right hip pain      Subjective Assessment - 11/28/15 1523    Subjective "about the same, still hurts about a 4, just this tight feeling and aching, sometimes a sharp pain not very often"   Currently in Pain? Yes   Pain Score 4                          [facility] Adult PT Treatment/Exercise - 11/28/15 0001    Ambulation/Gait   Gait Comments slight limp, Negotiated 2 flights of stairs with one rail alternating pattern mod I; amb outside uneven surfaces  up and down curbs and slopes, > 200 ft  with small limp.   Knee/Hip Exercises: Aerobic   Nustep Level 3 x 7 minutes   Knee/Hip Exercises: Machines for Strengthening   Cybex Knee Extension 10# 2x15    Cybex Knee Flexion 20# 2x15   Cybex Leg Press #25 2x15   Knee/Hip Exercises: Standing   Hip Abduction 15 reps;Both;1 set;Knee straight   Hip Extension 1 set;15 reps;Knee straight   Extension Limitations 2   Other Standing Knee Exercises Standing hip and knee flex #2 2x10   Other Standing Knee Exercises Standing march #2 2x10                   PT Short Term Goals - 11/28/15 1601    PT SHORT TERM GOAL #1   Title verbalize understanding of THR precautions   Status Achieved           PT Long Term Goals - 11/28/15 1601    PT LONG TERM  GOAL #1   Status Achieved   PT LONG TERM GOAL #2   Title increase AROM of the right hip to 90 degrees flexion in standing   Status Partially Met   PT LONG TERM GOAL #3   Title walk without asssitive device with minimal deviation   Status Achieved   PT LONG TERM GOAL #4   Title increase right hip strength to 4/5   Status Partially Met               Plan - 11/28/15 1559    Clinical Impression Statement Pt continues to perform well. Able to increase gait distance without AD with only a mild limp. Gait trial today preformed outside on uneven surfaces with no LOB. Pt shows carryover from last treatment with stair negotiation but requiring less assist.    Pt will benefit from skilled therapeutic intervention in order to improve on the following deficits Abnormal gait;Cardiopulmonary status limiting activity;Decreased activity tolerance;Decreased balance;Decreased range of motion;Decreased mobility;Decreased scar mobility;Decreased strength;Difficulty walking;Impaired flexibility;Pain   Rehab Potential Good   PT Frequency 2x / week  PT Duration 8 weeks   PT Treatment/Interventions ADLs/Self Care Home Management;Electrical Stimulation;Cryotherapy;Therapeutic exercise;Therapeutic activities;Functional mobility training;Stair training;Gait training;Balance training;Patient/family education;Manual techniques;Scar mobilization   PT Next Visit Plan get report from MD        Problem List There are no active problems to display for this patient. PHYSICAL THERAPY DISCHARGE SUMMARY  Visits from Start of Care:6 Plan: Patient agrees to discharge.  Patient goals were partially met. Patient is being discharged due to being pleased with the current functional level.  ?????     Scot Jun, PTA  11/28/2015, 4:02 PM  [address] [place], [place], [address] Phone: [phone]   Fax:  [phone]  Name: Ashley Barron MRN: [id number] Date of Birth: February 26, 1951

## 2020-01-12 ENCOUNTER — Ambulatory Visit: Payer: Commercial Managed Care - HMO | Attending: Internal Medicine

## 2020-01-12 DIAGNOSIS — Z23 Encounter for immunization: Secondary | ICD-10-CM | POA: Insufficient documentation

## 2020-01-12 NOTE — Progress Notes (Signed)
   Covid-19 Vaccination Clinic  Name:  Ashley Barron    MRN: [id number] DOB: September 16, 1951  01/12/2020  Ms. Carbon was observed post Covid-19 immunization for 15 minutes without incidence. She was provided with Vaccine Information Sheet and instruction to access the V-Safe system.   Ms. [name] was instructed to call 911 with any severe reactions post vaccine: Marland Kitchen Difficulty breathing  . Swelling of your face and throat  . A fast heartbeat  . A bad rash all over your body  . Dizziness and weakness    Immunizations Administered    Name Date Dose VIS Date Route   Pfizer COVID-19 Vaccine 01/12/2020  4:01 PM 0.3 mL 10/27/2019 Intramuscular   Manufacturer: [employer], [employer]   Lot: [id number]   NDC: [address]-1

## 2020-02-07 ENCOUNTER — Ambulatory Visit: Payer: Commercial Managed Care - HMO | Attending: Internal Medicine

## 2020-02-07 DIAGNOSIS — Z23 Encounter for immunization: Secondary | ICD-10-CM

## 2020-02-07 NOTE — Progress Notes (Signed)
   Covid-19 Vaccination Clinic  Name:  Ashley Barron    MRN: [id number] DOB: 1951-02-02  02/07/2020  [name] was observed post Covid-19 immunization for 30 minutes based on pre-vaccination screening without incident. She was provided with Vaccine Information Sheet and instruction to access the V-Safe system.   [name] was instructed to call 911 with any severe reactions post vaccine: Marland Kitchen Difficulty breathing  . Swelling of face and throat  . A fast heartbeat  . A bad rash all over body  . Dizziness and weakness   Immunizations Administered    Name Date Dose VIS Date Route   Pfizer COVID-19 Vaccine 02/07/2020  8:34 AM 0.3 mL 10/27/2019 Intramuscular   Manufacturer: [employer], [employer]   Lot: [id number]   NDC: [address]-5

## 2020-02-07 NOTE — Progress Notes (Signed)
   Covid-19 Vaccination Clinic  Name:  Ashley Barron    MRN: [id number] DOB: Jul 22, 1951  02/07/2020  [name] was observed post Covid-19 immunization for 15 minutes without incident. She was provided with Vaccine Information Sheet and instruction to access the V-Safe system.   [name] was instructed to call 911 with any severe reactions post vaccine: Marland Kitchen Difficulty breathing  . Swelling of face and throat  . A fast heartbeat  . A bad rash all over body  . Dizziness and weakness   Immunizations Administered    Name Date Dose VIS Date Route   Pfizer COVID-19 Vaccine 02/07/2020  8:34 AM 0.3 mL 10/27/2019 Intramuscular   Manufacturer: [employer], [employer]   Lot: [id number]   NDC: [address]-3

## 2024-01-17 DIAGNOSIS — K219 Gastro-esophageal reflux disease without esophagitis: Secondary | ICD-10-CM | POA: Diagnosis not present

## 2024-01-17 DIAGNOSIS — R7303 Prediabetes: Secondary | ICD-10-CM | POA: Diagnosis not present

## 2024-01-17 DIAGNOSIS — E7800 Pure hypercholesterolemia, unspecified: Secondary | ICD-10-CM | POA: Diagnosis not present

## 2024-01-17 DIAGNOSIS — M810 Age-related osteoporosis without current pathological fracture: Secondary | ICD-10-CM | POA: Diagnosis not present

## 2024-01-17 DIAGNOSIS — G629 Polyneuropathy, unspecified: Secondary | ICD-10-CM | POA: Diagnosis not present

## 2024-01-17 DIAGNOSIS — I10 Essential (primary) hypertension: Secondary | ICD-10-CM | POA: Diagnosis not present

## 2024-01-24 DIAGNOSIS — R918 Other nonspecific abnormal finding of lung field: Secondary | ICD-10-CM | POA: Diagnosis not present

## 2024-01-24 DIAGNOSIS — R911 Solitary pulmonary nodule: Secondary | ICD-10-CM | POA: Diagnosis not present

## 2024-01-24 DIAGNOSIS — J8410 Pulmonary fibrosis, unspecified: Secondary | ICD-10-CM | POA: Diagnosis not present

## 2024-01-24 DIAGNOSIS — J984 Other disorders of lung: Secondary | ICD-10-CM | POA: Diagnosis not present

## 2024-03-02 DIAGNOSIS — R7303 Prediabetes: Secondary | ICD-10-CM | POA: Diagnosis not present

## 2024-03-02 DIAGNOSIS — E7800 Pure hypercholesterolemia, unspecified: Secondary | ICD-10-CM | POA: Diagnosis not present

## 2024-03-02 DIAGNOSIS — D472 Monoclonal gammopathy: Secondary | ICD-10-CM | POA: Diagnosis not present

## 2024-07-21 DIAGNOSIS — D472 Monoclonal gammopathy: Secondary | ICD-10-CM | POA: Diagnosis not present

## 2024-07-21 DIAGNOSIS — Z91030 Bee allergy status: Secondary | ICD-10-CM | POA: Diagnosis not present

## 2024-07-21 DIAGNOSIS — I10 Essential (primary) hypertension: Secondary | ICD-10-CM | POA: Diagnosis not present

## 2024-07-21 DIAGNOSIS — M810 Age-related osteoporosis without current pathological fracture: Secondary | ICD-10-CM | POA: Diagnosis not present

## 2024-07-21 DIAGNOSIS — Z23 Encounter for immunization: Secondary | ICD-10-CM | POA: Diagnosis not present
